# Patient Record
Sex: Male | Born: 1985
Health system: Southern US, Community
[De-identification: ages and names within clinical notes are randomized; demographics above are authoritative.]

## PROBLEM LIST (undated history)

## (undated) DIAGNOSIS — F5101 Primary insomnia: Secondary | ICD-10-CM

## (undated) DIAGNOSIS — F411 Generalized anxiety disorder: Principal | ICD-10-CM

## (undated) HISTORY — DX: Generalized anxiety disorder: F41.1

## (undated) HISTORY — DX: Primary insomnia: F51.01

---

## 2018-12-07 ENCOUNTER — Encounter: Payer: Self-pay | Admitting: Family Medicine

## 2018-12-07 ENCOUNTER — Other Ambulatory Visit: Payer: Self-pay

## 2018-12-07 ENCOUNTER — Ambulatory Visit (INDEPENDENT_AMBULATORY_CARE_PROVIDER_SITE_OTHER): Payer: BLUE CROSS/BLUE SHIELD | Admitting: Family Medicine

## 2018-12-07 VITALS — BP 117/69 | Temp 98.6°F | Ht 72.0 in | Wt 260.0 lb

## 2018-12-07 DIAGNOSIS — F5101 Primary insomnia: Secondary | ICD-10-CM | POA: Diagnosis not present

## 2018-12-07 DIAGNOSIS — F411 Generalized anxiety disorder: Secondary | ICD-10-CM | POA: Diagnosis not present

## 2018-12-07 DIAGNOSIS — Z7189 Other specified counseling: Secondary | ICD-10-CM

## 2018-12-07 MED ORDER — ESCITALOPRAM OXALATE 10 MG PO TABS: 10.0000 mg | ORAL_TABLET | Freq: Every day | ORAL | 1 refills | Status: DC

## 2018-12-07 MED ORDER — LORAZEPAM 0.5 MG PO TABS: 0.5000 mg | ORAL_TABLET | Freq: Every day | ORAL | 0 refills | Status: DC

## 2018-12-07 NOTE — Progress Notes (Signed)
Virtual Visit via Video   Due to the COVID-19 pandemic, this visit was completed with telemedicine (audio/video) technology to reduce patient and provider exposure aswell as to preserve personal protective equipment.   I connected with Gregory Pennington by a video enabled telemedicine application and verified that I am speaking with the correct person using two identifiers. Location patient: Home Location provider: [place] [facility], Office Persons participating in the virtual visit: Gregory HeldJoshua R Pennington, Gregory Cremer, DO Gregory Pennington, CMA acting as scribe for Dr. Helane RimaErica Terence Pennington.   I discussed the limitations of evaluation and management by telemedicine and the availability of in person appointments. The patient expressed understanding and agreed to proceed.  Care Team   Patient Care Team: Gregory RimaWallace, Gregory Farha, DO as PCP - General (Family Medicine)  Subjective:   HPI: Patient has not seen PCP in over 12 years. He has had increased anxiety and depression. He has had issues with in the past but was able to use coping mechanisms to get through it. He has had episodes recently where he feel like his heart is racing and has "tightness" in chest but his pulse is never above 60. He has noticed that he has this when he he is getting "worked up" about something. He has not been sleeping at all recently. Patient has been taking Melatonin 10mg  but it is not helpful at all. His wife did give him 1/2 of a 1 mg ativan last night and he was able to sleep all night and feels better today than he has in a long time.  Patient has no history of drug use, no smoking does drink socially. He does normally exercise but has not been doing that recently. He works from home  Review of Systems  Constitutional: Negative for chills and fever.  HENT: Negative for hearing loss and tinnitus.   Eyes: Negative.   Respiratory: Negative.   Cardiovascular: Positive for chest pain. Negative for palpitations, orthopnea, claudication, leg  swelling and PND.       Has chest pain but pulse will be normal. Feels like heart is beating out of his chest.   Gastrointestinal: Negative.   Genitourinary: Negative.   Musculoskeletal: Negative.   Skin: Negative for rash.  Neurological: Negative.   Endo/Heme/Allergies: Negative.   Psychiatric/Behavioral: Negative.     Patient Active Problem List   Diagnosis Date Noted  . GAD (generalized anxiety disorder) 12/10/2018  . Primary insomnia 12/10/2018    Social History   Tobacco Use  . Smoking status: Never Smoker  . Smokeless tobacco: Never Used  Substance Use Topics  . Alcohol use: Yes    Comment: occ 3-4 drinks on weekend    Current Outpatient Medications:   No Known Allergies  Objective:   VITALS: Per patient if applicable, see vitals. GENERAL: Alert, appears well and in no acute distress. HEENT: Atraumatic, conjunctiva clear, no obvious abnormalities on inspection of external nose and ears. NECK: Normal movements of the head and neck. CARDIOPULMONARY: No increased WOB. Speaking in clear sentences. I:E ratio WNL.  MS: Moves all visible extremities without noticeable abnormality. PSYCH: Pleasant and cooperative, well-groomed. Speech normal rate and rhythm. Affect is appropriate. Insight and judgement are appropriate. Attention is focused, linear, and appropriate.  NEURO: CN grossly intact. Oriented as arrived to appointment on time with no prompting. Moves both UE equally.  SKIN: No obvious lesions, wounds, erythema, or cyanosis noted on face or hands.  Depression screen PHQ 2/9 12/07/2018  Decreased Interest 3  Down, Depressed, Hopeless  3  PHQ - 2 Score 6  Altered sleeping 3  Tired, decreased energy 3  Change in appetite 3  Feeling bad or failure about yourself  3  Trouble concentrating 0  Moving slowly or fidgety/restless 0  Suicidal thoughts 0  PHQ-9 Score 18  Difficult doing work/chores Very difficult    Assessment and Plan:   Gregory Pennington was seen today for  establish care.  Diagnoses and all orders for this visit:  GAD (generalized anxiety disorder) -     escitalopram (LEXAPRO) 10 MG tablet; Take 1 tablet (10 mg total) by mouth daily.  Educated About Covid-19 Virus Infection  Primary insomnia -     LORazepam (ATIVAN) 0.5 MG tablet; Take 1 tablet (0.5 mg total) by mouth at bedtime.    Marland Kitchen COVID-19 Education: The signs and symptoms of COVID-19 were discussed with the patient and how to seek care for testing if needed. The importance of social distancing was discussed today. . Reviewed expectations re: course of current medical issues. . Discussed self-management of symptoms. . Outlined signs and symptoms indicating need for more acute intervention. . Patient verbalized understanding and all questions were answered. Marland Kitchen Health Maintenance issues including appropriate healthy diet, exercise, and smoking avoidance were discussed with patient. . See orders for this visit as documented in the electronic medical record.  Gregory Rima, DO  Records requested if needed. Time spent: 30 minutes, of which >50% was spent in obtaining information about his symptoms, reviewing his previous labs, evaluations, and treatments, counseling him about his condition (please see the discussed topics above), and developing a plan to further investigate it; he had a number of questions which I addressed.

## 2018-12-10 ENCOUNTER — Encounter: Payer: Self-pay | Admitting: Family Medicine

## 2018-12-10 DIAGNOSIS — F411 Generalized anxiety disorder: Secondary | ICD-10-CM | POA: Insufficient documentation

## 2018-12-10 DIAGNOSIS — F5101 Primary insomnia: Secondary | ICD-10-CM

## 2018-12-10 HISTORY — DX: Primary insomnia: F51.01

## 2018-12-10 HISTORY — DX: Generalized anxiety disorder: F41.1

## 2018-12-10 NOTE — Patient Instructions (Signed)

## 2018-12-12 ENCOUNTER — Other Ambulatory Visit: Payer: Self-pay

## 2018-12-12 ENCOUNTER — Encounter (HOSPITAL_BASED_OUTPATIENT_CLINIC_OR_DEPARTMENT_OTHER): Payer: Self-pay

## 2018-12-12 ENCOUNTER — Emergency Department (HOSPITAL_BASED_OUTPATIENT_CLINIC_OR_DEPARTMENT_OTHER)
Admission: EM | Admit: 2018-12-12 | Discharge: 2018-12-12 | Disposition: A | Discharge status: Home / Self Care | Payer: BLUE CROSS/BLUE SHIELD | Attending: Emergency Medicine | Admitting: Emergency Medicine

## 2018-12-12 DIAGNOSIS — W501XXA Accidental kick by another person, initial encounter: Secondary | ICD-10-CM | POA: Diagnosis not present

## 2018-12-12 DIAGNOSIS — S0592XA Unspecified injury of left eye and orbit, initial encounter: Secondary | ICD-10-CM | POA: Insufficient documentation

## 2018-12-12 DIAGNOSIS — Y999 Unspecified external cause status: Secondary | ICD-10-CM | POA: Insufficient documentation

## 2018-12-12 DIAGNOSIS — Y939 Activity, unspecified: Secondary | ICD-10-CM | POA: Insufficient documentation

## 2018-12-12 DIAGNOSIS — Y929 Unspecified place or not applicable: Secondary | ICD-10-CM | POA: Insufficient documentation

## 2018-12-12 MED ORDER — TETRACAINE HCL 0.5 % OP SOLN
2.0000 [drp] | Freq: Once | OPHTHALMIC | Status: AC
  Administered 2018-12-12: 19:00:00 2 [drp] via OPHTHALMIC
  Filled 2018-12-12: qty 4

## 2018-12-12 MED ORDER — MOXIFLOXACIN HCL 0.5 % OP SOLN: 1.0000 [drp] | Freq: Two times a day (BID) | OPHTHALMIC | 0 refills | Status: AC

## 2018-12-12 MED ORDER — FLUORESCEIN SODIUM 1 MG OP STRP
1.0000 | ORAL_STRIP | Freq: Once | OPHTHALMIC | Status: AC
  Administered 2018-12-12: 19:00:00 1 via OPHTHALMIC
  Filled 2018-12-12: qty 1
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
## 2018-12-12 NOTE — ED Notes (Signed)
ED Provider at bedside. 

## 2018-12-12 NOTE — Discharge Instructions (Addendum)
Use the antibiotic drops twice daily for 5 days. May lightly apply ice or cold pack to the eye and surrounding face to reduce pain and swelling.  Antiinflammatory medications: Take 600 mg of ibuprofen every 6 hours or 440 mg (over the counter dose) to 500 mg (prescription dose) of naproxen every 12 hours for the next 3 days. After this time, these medications may be used as needed for pain. Take these medications with food to avoid upset stomach. Choose only one of these medications, do not take them together. Acetaminophen (generic for Tylenol): Should you continue to have additional pain while taking the ibuprofen or naproxen, you may add in acetaminophen as needed. Your daily total maximum amount of acetaminophen from all sources should be limited to 4000mg /day for persons without liver problems, or 2000mg /day for those with liver problems.

## 2018-12-12 NOTE — ED Notes (Signed)
Pt states that he normally wears contacts but does not currently have them in
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
## 2019-01-01 ENCOUNTER — Other Ambulatory Visit: Payer: Self-pay | Admitting: Family Medicine

## 2019-01-01 DIAGNOSIS — F5101 Primary insomnia: Secondary | ICD-10-CM

## 2019-01-09 ENCOUNTER — Ambulatory Visit: Payer: Medicaid Other | Admitting: Family Medicine

## 2019-01-10 NOTE — Progress Notes (Signed)
Virtual Visit via Video   Due to the COVID-19 pandemic, this visit was completed with telemedicine (audio/video) technology to reduce patient and provider exposure as well as to preserve personal protective equipment.   I connected with Gregory Pennington by a video enabled telemedicine application and verified that I am speaking with the correct person using two identifiers. Location patient: Home Location provider: [facility], Office Persons participating in the virtual visit: Gregory Pennington, Nee, DO Barnie Mort, CMA acting as scribe for Dr. Helane Rima.   I discussed the limitations of evaluation and management by telemedicine and the availability of in person appointments. The patient expressed understanding and agreed to proceed.  Care Team   Patient Care Team: Helane Rima, DO as PCP - General (Family Medicine)  Subjective:   HPI: He has had increased headaches over the last two weeks but thinks it may be contacts that need changed.   1. GAD (generalized anxiety disorder) Patient has been taking Lexapro daily. He does see some improvement but does not feel like it is going as well as it could. He he gets home is when he starts having increased anxiety.   2. Primary insomnia  The ativan is helping sleep but has noticed that he is not getting good sleep. Feels like he is tossing and turning a lot during the night.    Depression screen [facility] 2/9 01/11/2019 12/07/2018  Decreased Interest 3 3  Down, Depressed, Hopeless 3 3  PHQ - 2 Score 6 6  Altered sleeping 0 3  Tired, decreased energy 0 3  Change in appetite 3 3  Feeling bad or failure about yourself  3 3  Trouble concentrating 0 0  Moving slowly or fidgety/restless 0 0  Suicidal thoughts 0 0  PHQ-9 Score 12 18  Difficult doing work/chores Somewhat difficult Very difficult   GAD 7 : Generalized Anxiety Score 01/11/2019 12/07/2018  Nervous, Anxious, on Edge 3 3  Control/stop worrying 3 3  Worry too much -  different things 3 3  Trouble relaxing 3 3  Restless 0 3  Easily annoyed or irritable 3 3  Afraid - awful might happen 1 3  Total GAD 7 Score 16 21  Anxiety Difficulty Somewhat difficult Very difficult   Review of Systems  Constitutional: Negative for chills, fever, malaise/fatigue and weight loss.  HENT: Negative for hearing loss and tinnitus.   Eyes: Negative for blurred vision and double vision.  Respiratory: Negative for cough, shortness of breath and wheezing.   Cardiovascular: Negative for chest pain, palpitations and leg swelling.  Gastrointestinal: Negative for abdominal pain, constipation, diarrhea, nausea and vomiting.  Genitourinary: Negative for dysuria and urgency.  Musculoskeletal: Negative for joint pain and myalgias.  Skin: Negative for rash.  Neurological: Negative for dizziness and headaches.  Psychiatric/Behavioral: Negative for depression, substance abuse and suicidal ideas. The patient is not nervous/anxious.     Patient Active Problem List   Diagnosis Date Noted  . GAD (generalized anxiety disorder) 12/10/2018  . Primary insomnia 12/10/2018    Social History   Tobacco Use  . Smoking status: Never Smoker  . Smokeless tobacco: Never Used  Substance Use Topics  . Alcohol use: Yes    Comment: occ 3-4 drinks on weekend     Current Outpatient Medications:  .  escitalopram (LEXAPRO) 20 MG tablet, Take 1 tablet (20 mg total) by mouth daily., Disp: 30 tablet, Rfl: 2 .  LORazepam (ATIVAN) 1 MG tablet, Take 1 tablet (1 mg  total) by mouth at bedtime., Disp: 30 tablet, Rfl: 1  No Known Allergies  Objective:   VITALS: Per patient if applicable, see vitals. GENERAL: Alert, appears well and in no acute distress. HEENT: Atraumatic, conjunctiva clear, no obvious abnormalities on inspection of external nose and ears. NECK: Normal movements of the head and neck. CARDIOPULMONARY: No increased WOB. Speaking in clear sentences. I:E ratio WNL.  MS: Moves all visible  extremities without noticeable abnormality. PSYCH: Pleasant and cooperative, well-groomed. Speech normal rate and rhythm. Affect is appropriate. Insight and judgement are appropriate. Attention is focused, linear, and appropriate.  NEURO: CN grossly intact. Oriented as arrived to appointment on time with no prompting. Moves both UE equally.  SKIN: No obvious lesions, wounds, erythema, or cyanosis noted on face or hands.  Assessment and Plan:   Gregory Pennington was seen today for follow-up.  Diagnoses and all orders for this visit:  GAD (generalized anxiety disorder) -     escitalopram (LEXAPRO) 20 MG tablet; Take 1 tablet (20 mg total) by mouth daily.  Primary insomnia -     LORazepam (ATIVAN) 1 MG tablet; Take 1 tablet (1 mg total) by mouth at bedtime.  Screening for lipid disorders -     Lipid panel; Future  Fatigue, unspecified type -     CBC with Differential/Platelet; Future -     Comprehensive metabolic panel; Future -     TSH; Future  Hyperglycemia -     Hemoglobin A1c; Future    . COVID-19 Education: The signs and symptoms of COVID-19 were discussed with the patient and how to seek care for testing if needed. The importance of social distancing was discussed today. . Reviewed expectations re: course of current medical issues. . Discussed self-management of symptoms. . Outlined signs and symptoms indicating need for more acute intervention. . Patient verbalized understanding and all questions were answered. Marland Kitchen. Health Maintenance issues including appropriate healthy diet, exercise, and smoking avoidance were discussed with patient. . See orders for this visit as documented in the electronic medical record.  Helane RimaErica Zavannah Deblois, DO  Records requested if needed. Time spent: 25 minutes, of which >50% was spent in obtaining information about his symptoms, reviewing his previous labs, evaluations, and treatments, counseling him about his condition (please see the discussed topics above), and  developing a plan to further investigate it; he had a number of questions which I addressed.

## 2019-01-11 ENCOUNTER — Other Ambulatory Visit: Payer: Self-pay

## 2019-01-11 ENCOUNTER — Encounter: Payer: Self-pay | Admitting: Family Medicine

## 2019-01-11 ENCOUNTER — Ambulatory Visit (INDEPENDENT_AMBULATORY_CARE_PROVIDER_SITE_OTHER): Payer: BC Managed Care – PPO | Admitting: Family Medicine

## 2019-01-11 VITALS — Ht 72.0 in | Wt 255.0 lb

## 2019-01-11 DIAGNOSIS — R5383 Other fatigue: Secondary | ICD-10-CM

## 2019-01-11 DIAGNOSIS — R739 Hyperglycemia, unspecified: Secondary | ICD-10-CM

## 2019-01-11 DIAGNOSIS — F5101 Primary insomnia: Secondary | ICD-10-CM | POA: Diagnosis not present

## 2019-01-11 DIAGNOSIS — F411 Generalized anxiety disorder: Secondary | ICD-10-CM | POA: Diagnosis not present

## 2019-01-11 DIAGNOSIS — Z13220 Encounter for screening for lipoid disorders: Secondary | ICD-10-CM

## 2019-01-11 MED ORDER — LORAZEPAM 1 MG PO TABS: 1.0000 mg | ORAL_TABLET | Freq: Every day | ORAL | 1 refills | Status: DC

## 2019-01-11 MED ORDER — ESCITALOPRAM OXALATE 20 MG PO TABS: 20.0000 mg | ORAL_TABLET | Freq: Every day | ORAL | 2 refills | Status: DC

## 2019-01-15 ENCOUNTER — Other Ambulatory Visit: Payer: Self-pay

## 2019-01-15 ENCOUNTER — Other Ambulatory Visit (INDEPENDENT_AMBULATORY_CARE_PROVIDER_SITE_OTHER): Payer: BC Managed Care – PPO

## 2019-01-15 DIAGNOSIS — Z13220 Encounter for screening for lipoid disorders: Secondary | ICD-10-CM

## 2019-01-15 DIAGNOSIS — R739 Hyperglycemia, unspecified: Secondary | ICD-10-CM | POA: Diagnosis not present

## 2019-01-15 DIAGNOSIS — R5383 Other fatigue: Secondary | ICD-10-CM

## 2019-01-15 LAB — COMPREHENSIVE METABOLIC PANEL
ALT: 20 U/L (ref 0–53)
AST: 18 U/L (ref 0–37)
Albumin: 4.3 g/dL (ref 3.5–5.2)
Alkaline Phosphatase: 47 U/L (ref 39–117)
BUN: 15 mg/dL (ref 6–23)
CO2: 28 mEq/L (ref 19–32)
Calcium: 9.1 mg/dL (ref 8.4–10.5)
Chloride: 104 mEq/L (ref 96–112)
Creatinine, Ser: 0.81 mg/dL (ref 0.40–1.50)
GFR: 108.45 mL/min (ref >60.00)
Glucose, Bld: 93 mg/dL (ref 70–99)
Potassium: 3.9 mEq/L (ref 3.5–5.1)
Sodium: 139 mEq/L (ref 135–145)
Total Bilirubin: 0.8 mg/dL (ref 0.2–1.2)
Total Protein: 6.6 g/dL (ref 6.0–8.3)

## 2019-01-15 LAB — CBC WITH DIFFERENTIAL/PLATELET
Basophils Absolute: 0.1 10*3/uL (ref 0.0–0.1)
Basophils Relative: 0.9 % (ref 0.0–3.0)
Eosinophils Absolute: 0.2 10*3/uL (ref 0.0–0.7)
Eosinophils Relative: 3.5 % (ref 0.0–5.0)
HCT: 45.3 % (ref 39.0–52.0)
Hemoglobin: 15.5 g/dL (ref 13.0–17.0)
Lymphocytes Relative: 35.4 % (ref 12.0–46.0)
Lymphs Abs: 2.1 10*3/uL (ref 0.7–4.0)
MCHC: 34.3 g/dL (ref 30.0–36.0)
MCV: 88.8 fl (ref 78.0–100.0)
Monocytes Absolute: 0.5 10*3/uL (ref 0.1–1.0)
Monocytes Relative: 8.8 % (ref 3.0–12.0)
Neutro Abs: 3.1 10*3/uL (ref 1.4–7.7)
Neutrophils Relative %: 51.4 % (ref 43.0–77.0)
Platelets: 131 10*3/uL — ABNORMAL LOW (ref 150.0–400.0)
RBC: 5.1 Mil/uL (ref 4.22–5.81)
RDW: 12.8 % (ref 11.5–15.5)
WBC: 6 10*3/uL (ref 4.0–10.5)

## 2019-01-15 LAB — HEMOGLOBIN A1C: Hgb A1c MFr Bld: 5.3 % (ref 4.6–6.5)

## 2019-01-15 LAB — LIPID PANEL
Cholesterol: 197 mg/dL (ref 0–200)
HDL: 53.5 mg/dL (ref >39.00)
LDL Cholesterol: 124 mg/dL — ABNORMAL HIGH (ref 0–99)
NonHDL: 143.93
Total CHOL/HDL Ratio: 4
Triglycerides: 100 mg/dL (ref 0.0–149.0)
VLDL: 20 mg/dL (ref 0.0–40.0)

## 2019-01-15 LAB — TSH: TSH: 1.57 u[IU]/mL (ref 0.35–4.50)

## 2019-02-01 ENCOUNTER — Ambulatory Visit: Payer: BC Managed Care – PPO | Admitting: Family Medicine

## 2019-02-04 ENCOUNTER — Encounter: Payer: Self-pay | Admitting: Family Medicine

## 2019-02-04 ENCOUNTER — Ambulatory Visit (INDEPENDENT_AMBULATORY_CARE_PROVIDER_SITE_OTHER): Payer: BC Managed Care – PPO | Admitting: Family Medicine

## 2019-02-04 ENCOUNTER — Other Ambulatory Visit: Payer: Self-pay

## 2019-02-04 VITALS — BP 106/73 | HR 63 | Temp 98.1°F | Ht 72.0 in | Wt 255.0 lb

## 2019-02-04 DIAGNOSIS — F5101 Primary insomnia: Secondary | ICD-10-CM

## 2019-02-04 DIAGNOSIS — F411 Generalized anxiety disorder: Secondary | ICD-10-CM | POA: Diagnosis not present

## 2019-02-04 MED ORDER — LORAZEPAM 1 MG PO TABS: 1.0000 mg | ORAL_TABLET | Freq: Every day | ORAL | 1 refills | Status: DC

## 2019-02-04 MED ORDER — ESCITALOPRAM OXALATE 20 MG PO TABS: 20.0000 mg | ORAL_TABLET | Freq: Every day | ORAL | 2 refills | Status: DC

## 2019-02-04 NOTE — Progress Notes (Signed)
Virtual Visit via Video   Due to the COVID-19 pandemic, this visit was completed with telemedicine (audio/video) technology to reduce patient and provider exposure as well as to preserve personal protective equipment.   I connected with Ned GraceJoshua R Merkley by a video enabled telemedicine application and verified that I am speaking with the correct person using two identifiers. Location patient: Home Location provider: [facility], Office Persons participating in the virtual visit: Ann HeldJoshua R Schaller, Alynn Ellithorpe, DO   I discussed the limitations of evaluation and management by telemedicine and the availability of in person appointments. The patient expressed understanding and agreed to proceed.  Care Team   Patient Care Team: Helane RimaWallace, Ziya Coonrod, DO as PCP - General (Family Medicine)  Subjective:   HPI: Doing very well on current regimen of Lexapro 10 mg po BID, Ativan 1 mg po q hs. Tolerating medications with no side effects other than hunger. He feels that he can handle the hunger and maintain or lose weight. No ETOH, except 1/2 can beer the other day while walking through the woods. Daughter had episode of SI over the weekend and he was able to handle it well.  Review of Systems  Constitutional: Negative for chills, fever, malaise/fatigue and weight loss.  Respiratory: Negative for cough, shortness of breath and wheezing.   Cardiovascular: Negative for chest pain, palpitations and leg swelling.  Gastrointestinal: Negative for abdominal pain, constipation, diarrhea, nausea and vomiting.  Genitourinary: Negative for dysuria and urgency.  Musculoskeletal: Negative for joint pain and myalgias.  Skin: Negative for rash.  Neurological: Negative for dizziness and headaches.  Psychiatric/Behavioral: Negative for depression, substance abuse and suicidal ideas. The patient is not nervous/anxious.     Patient Active Problem List   Diagnosis Date Noted  . GAD (generalized anxiety disorder)  12/10/2018  . Primary insomnia 12/10/2018    Social History   Tobacco Use  . Smoking status: Never Smoker  . Smokeless tobacco: Never Used  Substance Use Topics  . Alcohol use: Yes    Comment: occ 3-4 drinks on weekend    Current Outpatient Medications:  .  escitalopram (LEXAPRO) 20 MG tablet, Take 1 tablet (20 mg total) by mouth daily., Disp: 30 tablet, Rfl: 2 .  ibuprofen (ADVIL) 600 MG tablet, Take 600 mg by mouth every 6 (six) hours as needed. Takes prn, Disp: , Rfl:  .  LORazepam (ATIVAN) 1 MG tablet, Take 1 tablet (1 mg total) by mouth at bedtime., Disp: 30 tablet, Rfl: 1  No Known Allergies  Objective:   VITALS: Per patient if applicable, see vitals. GENERAL: Alert and in no acute distress. CARDIOPULMONARY: No increased WOB. Speaking in clear sentences.  PSYCH: Pleasant and cooperative. Speech normal rate and rhythm. Affect is appropriate. Insight and judgement are appropriate. Attention is focused, linear, and appropriate.  NEURO: Oriented as arrived to appointment on time with no prompting.   Assessment and Plan:   Diagnoses and all orders for this visit:  GAD (generalized anxiety disorder) Comments: Taking Lexapro 10 mg po BID. Doing well. Feeling much better. Wants to continue with the regimen. Orders: -     escitalopram (LEXAPRO) 20 MG tablet; Take 1 tablet (20 mg total) by mouth daily.  Primary insomnia Comments: Regimen working. Will continue. Orders: -     LORazepam (ATIVAN) 1 MG tablet; Take 1 tablet (1 mg total) by mouth at bedtime.   Marland Kitchen. COVID-19 Education: The signs and symptoms of COVID-19 were discussed with the patient and how to seek care  for testing if needed. The importance of social distancing was discussed today. . Reviewed expectations re: course of current medical issues. . Discussed self-management of symptoms. . Outlined signs and symptoms indicating need for more acute intervention. . Patient verbalized understanding and all questions were  answered. Marland Kitchen Health Maintenance issues including appropriate healthy diet, exercise, and smoking avoidance were discussed with patient. . See orders for this visit as documented in the electronic medical record.  Briscoe Deutscher, DO  Records requested if needed. Time spent: 25 minutes, of which >50% was spent in obtaining information about his symptoms, reviewing his previous labs, evaluations, and treatments, counseling him about his condition (please see the discussed topics above), and developing a plan to further investigate it; he had a number of questions which I addressed.

## 2019-05-07 ENCOUNTER — Ambulatory Visit: Payer: BC Managed Care – PPO | Admitting: Family Medicine

## 2019-08-20 ENCOUNTER — Telehealth: Payer: Self-pay | Admitting: Physician Assistant

## 2019-08-20 NOTE — Telephone Encounter (Signed)
Please call and work patient in this week to be seen for TOC and to follow up on anxiety and medication refill. Thanks. Pt must have visit before medication can be refilled.

## 2019-08-20 NOTE — Telephone Encounter (Signed)
Patient's wife callss in doing the [facility] to [facility] and also wanted to know if her husband could get a refill on ATIVAN 1 MG, he has 8 pills left.

## 2019-08-20 NOTE — Telephone Encounter (Signed)
Scheduled for Friday @ 1pm

## 2019-08-23 ENCOUNTER — Ambulatory Visit (INDEPENDENT_AMBULATORY_CARE_PROVIDER_SITE_OTHER): Payer: BC Managed Care – PPO | Admitting: Physician Assistant

## 2019-08-23 ENCOUNTER — Encounter: Payer: Self-pay | Admitting: Physician Assistant

## 2019-08-23 VITALS — Ht 72.0 in | Wt 250.0 lb

## 2019-08-23 DIAGNOSIS — F5101 Primary insomnia: Secondary | ICD-10-CM | POA: Diagnosis not present

## 2019-08-23 DIAGNOSIS — F411 Generalized anxiety disorder: Secondary | ICD-10-CM | POA: Diagnosis not present

## 2019-08-23 MED ORDER — ESCITALOPRAM OXALATE 10 MG PO TABS: 10.0000 mg | ORAL_TABLET | Freq: Every day | ORAL | 1 refills | Status: DC

## 2019-08-23 MED ORDER — TRAZODONE HCL 50 MG PO TABS: 25.0000 mg | ORAL_TABLET | Freq: Every evening | ORAL | 3 refills | Status: DC | PRN

## 2019-08-23 NOTE — Progress Notes (Signed)
Virtual Visit via Video   I connected with Gregory Pennington on 08/23/19 at  1:00 PM EST by a video enabled telemedicine application and verified that I am speaking with the correct person using two identifiers. Location patient: Home Location provider: [facility], Office Persons participating in the virtual visit: Novak, Stgermaine PA-C,Donna Titus Dubin, LPN   I discussed the limitations of evaluation and management by telemedicine and the availability of in person appointments. The patient expressed understanding and agreed to proceed.  I acted as a Education administrator for [employer], PA-C [employer], LPN  Subjective:   HPI:   Anxiety Pt following up, presently prescribed Lexapro 20 mg but has only been taking 10 mg past few months because he did not like the way it made him feel, states at times it made him feel "droopy." Denies SI/HI. Although his GAD score was elevated today he reports that he feels like he is in a good place with his mood.  GAD 7 : Generalized Anxiety Score 08/23/2019 02/04/2019 01/11/2019 12/07/2018  Nervous, Anxious, on Edge 3 1 3 3   Control/stop worrying 3 1 3 3   Worry too much - different things 3 1 3 3   Trouble relaxing 2 1 3 3   Restless 0 1 0 3  Easily annoyed or irritable 3 1 3 3   Afraid - awful might happen 3 1 1 3   Total GAD 7 Score 17 7 16 21   Anxiety Difficulty Somewhat difficult Somewhat difficult Somewhat difficult Very difficult    Insomnia Pt following up, presently taking Lorazepam 1 mg at bedtime and he is sleeping all night. He feels like the medication is working well for him. Has not tried any other medication for sleep in the past.  ROS: See pertinent positives and negatives per HPI.  Patient Active Problem List   Diagnosis Date Noted  . GAD (generalized anxiety disorder) 12/10/2018  . Primary insomnia 12/10/2018    Social History   Tobacco Use  . Smoking status: Never Smoker  . Smokeless tobacco: Never Used  Substance Use  Topics  . Alcohol use: Yes    Comment: occ 3-4 drinks on weekend     Current Outpatient Medications:  .  escitalopram (LEXAPRO) 20 MG tablet, Take 1 tablet (20 mg total) by mouth daily., Disp: 90 tablet, Rfl: 2 .  ibuprofen (ADVIL) 600 MG tablet, Take 600 mg by mouth every 6 (six) hours as needed. Takes prn, Disp: , Rfl:  .  LORazepam (ATIVAN) 1 MG tablet, Take 1 tablet (1 mg total) by mouth at bedtime., Disp: 90 tablet, Rfl: 1  No Known Allergies  Objective:   VITALS: Per patient if applicable, see vitals. GENERAL: Alert, appears well and in no acute distress. HEENT: Atraumatic, conjunctiva clear, no obvious abnormalities on inspection of external nose and ears. NECK: Normal movements of the head and neck. CARDIOPULMONARY: No increased WOB. Speaking in clear sentences. I:E ratio WNL.  MS: Moves all visible extremities without noticeable abnormality. PSYCH: Pleasant and cooperative, well-groomed. Speech normal rate and rhythm. Affect is appropriate. Insight and judgement are appropriate. Attention is focused, linear, and appropriate.  NEURO: CN grossly intact. Oriented as arrived to appointment on time with no prompting. Moves both UE equally.  SKIN: No obvious lesions, wounds, erythema, or cyanosis noted on face or hands.  Assessment and Plan:   Bracy was seen today for anxiety and insomnia.  Diagnoses and all orders for this visit:  GAD (generalized anxiety disorder) Well controlled per  patient. Continue Lexapro 10 mg daily. Follow-up in 6 months, sooner if concerns.  Primary insomnia Discussed my concerns with use of Ativan for sleep, as this medication has potential for abuse and dependency. He has not tried other rx medications. Will trial trazodone, patient is agreeable. He will let us know via MyChart if he is having any issues with this medication.  . Reviewed expectations re: course of current medical issues. . Discussed self-management of symptoms. . Outlined signs  and symptoms indicating need for more acute intervention. . Patient verbalized understanding and all questions were answered. Marland Kitchen Health Maintenance issues including appropriate healthy diet, exercise, and smoking avoidance were discussed with patient. . See orders for this visit as documented in the electronic medical record.  I discussed the assessment and treatment plan with the patient. The patient was provided an opportunity to ask questions and all were answered. The patient agreed with the plan and demonstrated an understanding of the instructions.   The patient was advised to call back or seek an in-person evaluation if the symptoms worsen or if the condition fails to improve as anticipated.   CMA or LPN served as scribe during this visit. History, Physical, and Plan performed by medical provider. The above documentation has been reviewed and is accurate and complete.   [place], [place] 08/23/2019

## 2019-08-27 ENCOUNTER — Encounter: Payer: Self-pay | Admitting: Physician Assistant

## 2019-08-27 ENCOUNTER — Other Ambulatory Visit: Payer: Self-pay | Admitting: Physician Assistant

## 2019-08-27 DIAGNOSIS — F5101 Primary insomnia: Secondary | ICD-10-CM

## 2019-08-27 MED ORDER — LORAZEPAM 1 MG PO TABS: 1.0000 mg | ORAL_TABLET | Freq: Every day | ORAL | 1 refills | Status: DC

## 2019-09-16 ENCOUNTER — Encounter: Payer: BC Managed Care – PPO | Admitting: Physician Assistant

## 2020-02-11 ENCOUNTER — Other Ambulatory Visit: Payer: Self-pay | Admitting: Physician Assistant

## 2020-02-11 DIAGNOSIS — F5101 Primary insomnia: Secondary | ICD-10-CM

## 2020-02-11 NOTE — Telephone Encounter (Signed)
Pt requesting refill on Lorazepam, last OV 08/2019.

## 2020-02-11 NOTE — Telephone Encounter (Signed)
Spoke to pt told him Lelon Mast gave him only a 30 day supply with no further refills until seen. Pt verbalized understanding and said would like to make an appt now due to medication not working well.Appointment scheduled for Friday at 7:30 AM virtual with [facility].Pt verbalized understanding.

## 2020-02-11 NOTE — Telephone Encounter (Signed)
Will give 30 day courtesy refill.  No further refills until office visit.

## 2020-02-13 NOTE — Progress Notes (Signed)
   TELEPHONE ENCOUNTER   Patient verbally agreed to telephone visit and is aware that copayment and coinsurance may apply. Patient was treated using telemedicine according to accepted telemedicine protocols.  Location of the patient: home Location of provider: [place] [facility] [employer] of all persons participating in the telemedicine service and role in the encounter: Jarold Motto, [place] , Donnetta Hail  Subjective:   Chief Complaint  Patient presents with  . Anxiety  . Insomnia     HPI   Anxiety Pt currently taking Lexapro 20 mg daily x 3 months. Pt is feeling better. Denies SI/HI. He is taking this medication without any concerning side effects.  Insomnia Pt following up, currently taking Lorazepam 1 mg at bedtime. He feels like it has not been working as well for him over the past 3 months. Pt is getting 7 hours of sleep. It is taking him longer to go to sleep. Pt has been taking CBD gummies to help. He does admit to binge drinking on the weekends and this is affecting his sleep. He doesn't take this medication if he has had "too much to drink."  Plantar Fasciitis History of this in both feet. Requires injections prn. Last injection was 4 years ago. He had a cortisone shot in L foot x 4 years ago. He is in need of repeat injections.    Patient Active Problem List   Diagnosis Date Noted  . GAD (generalized anxiety disorder) 12/10/2018  . Primary insomnia 12/10/2018   Social History   Tobacco Use  . Smoking status: Never Smoker  . Smokeless tobacco: Never Used  Substance Use Topics  . Alcohol use: Yes    Comment: occ 3-4 drinks on weekend     Current Outpatient Medications:  .  ibuprofen (ADVIL) 600 MG tablet, Take 600 mg by mouth every 6 (six) hours as needed. Takes prn, Disp: , Rfl:  .  LORazepam (ATIVAN) 1 MG tablet, Take 1 tablet (1 mg total) by mouth at bedtime., Disp: 30 tablet, Rfl: 0 No Known Allergies  Assessment & Plan:   1. Primary insomnia  Counseled  on alcohol use and sleep disorder. Encouraged patient to decrease alcohol intake. On nights where is not drinking, he may take ativan. Otherwise, I discussed that I don't have any other safe alternatives with his current alcohol intake. Patient verbalized understanding. I have asked him to let me know if/when he is ready to discuss reduction in alcohol.  2. GAD (generalized anxiety disorder) Improved. Continue Lexapro 20 mg daily. Follow-up in 6 months.  3. Plantar fasciitis  Referral to sports medicine.    No orders of the defined types were placed in this encounter.  No orders of the defined types were placed in this encounter.   Jarold Motto, [place] 02/14/2020  Time spent with the patient: 15 minutes, spent in obtaining information about his symptoms, reviewing his previous labs, evaluations, and treatments, counseling him about his condition (please see the discussed topics above), and developing a plan to further investigate it; he had a number of questions which I addressed.

## 2020-02-14 ENCOUNTER — Telehealth (INDEPENDENT_AMBULATORY_CARE_PROVIDER_SITE_OTHER): Payer: BC Managed Care – PPO | Admitting: Physician Assistant

## 2020-02-14 ENCOUNTER — Other Ambulatory Visit: Payer: Self-pay

## 2020-02-14 ENCOUNTER — Encounter: Payer: Self-pay | Admitting: Physician Assistant

## 2020-02-14 VITALS — Ht 72.0 in | Wt 270.0 lb

## 2020-02-14 DIAGNOSIS — F5101 Primary insomnia: Secondary | ICD-10-CM

## 2020-02-14 DIAGNOSIS — F411 Generalized anxiety disorder: Secondary | ICD-10-CM | POA: Diagnosis not present

## 2020-02-14 DIAGNOSIS — M722 Plantar fascial fibromatosis: Secondary | ICD-10-CM

## 2020-02-14 MED ORDER — LORAZEPAM 1 MG PO TABS: 1.0000 mg | ORAL_TABLET | Freq: Every day | ORAL | 2 refills | Status: AC

## 2020-02-14 MED ORDER — ESCITALOPRAM OXALATE 20 MG PO TABS: 20.0000 mg | ORAL_TABLET | Freq: Every day | ORAL | 1 refills | Status: AC

## 2020-02-19 ENCOUNTER — Emergency Department (HOSPITAL_BASED_OUTPATIENT_CLINIC_OR_DEPARTMENT_OTHER)
Admission: EM | Admit: 2020-02-19 | Discharge: 2020-02-19 | Disposition: A | Discharge status: Home / Self Care | Payer: 59 | Attending: Emergency Medicine | Admitting: Emergency Medicine

## 2020-02-19 ENCOUNTER — Emergency Department (HOSPITAL_BASED_OUTPATIENT_CLINIC_OR_DEPARTMENT_OTHER): Payer: 59

## 2020-02-19 ENCOUNTER — Encounter (HOSPITAL_BASED_OUTPATIENT_CLINIC_OR_DEPARTMENT_OTHER): Payer: Self-pay | Admitting: *Deleted

## 2020-02-19 ENCOUNTER — Other Ambulatory Visit: Payer: Self-pay

## 2020-02-19 DIAGNOSIS — Y939 Activity, unspecified: Secondary | ICD-10-CM | POA: Diagnosis not present

## 2020-02-19 DIAGNOSIS — S92511A Displaced fracture of proximal phalanx of right lesser toe(s), initial encounter for closed fracture: Secondary | ICD-10-CM | POA: Insufficient documentation

## 2020-02-19 DIAGNOSIS — S90121A Contusion of right lesser toe(s) without damage to nail, initial encounter: Secondary | ICD-10-CM | POA: Insufficient documentation

## 2020-02-19 DIAGNOSIS — W228XXA Striking against or struck by other objects, initial encounter: Secondary | ICD-10-CM | POA: Insufficient documentation

## 2020-02-19 DIAGNOSIS — S92911A Unspecified fracture of right toe(s), initial encounter for closed fracture: Secondary | ICD-10-CM

## 2020-02-19 DIAGNOSIS — Y999 Unspecified external cause status: Secondary | ICD-10-CM | POA: Diagnosis not present

## 2020-02-19 DIAGNOSIS — Y929 Unspecified place or not applicable: Secondary | ICD-10-CM | POA: Insufficient documentation

## 2020-02-19 DIAGNOSIS — S99921A Unspecified injury of right foot, initial encounter: Secondary | ICD-10-CM | POA: Diagnosis present

## 2020-02-19 DIAGNOSIS — S92531A Displaced fracture of distal phalanx of right lesser toe(s), initial encounter for closed fracture: Secondary | ICD-10-CM | POA: Diagnosis not present

## 2020-02-19 NOTE — ED Provider Notes (Signed)
[facility] EMERGENCY DEPARTMENT Provider Note   CSN: [id number] Arrival date & time: 02/19/20  1756     History Chief Complaint  Patient presents with  . Foot Injury    Gregory Pennington is a 34 y.o. male.  Patient is a 34 year old male presenting with complaints of a right foot injury.  Patient stuck his toe on a door this morning.  He now has purple discoloration and swelling.  Pain is worse with ambulation and relieved with rest.  The history is provided by the patient.       Past Medical History:  Diagnosis Date  . GAD (generalized anxiety disorder) 12/10/2018  . Primary insomnia 12/10/2018    Patient Active Problem List   Diagnosis Date Noted  . GAD (generalized anxiety disorder) 12/10/2018  . Primary insomnia 12/10/2018    History reviewed. No pertinent surgical history.     Family History  Problem Relation Age of Onset  . Diabetes Mother   . Diabetes Father     Social History   Tobacco Use  . Smoking status: Never Smoker  . Smokeless tobacco: Never Used  Substance Use Topics  . Alcohol use: Yes    Comment: occ 3-4 drinks on weekend   . Drug use: Never    Home Medications Prior to Admission medications   Medication Sig Start Date End Date Taking? Authorizing Provider  escitalopram (LEXAPRO) 20 MG tablet Take 1 tablet (20 mg total) by mouth daily. 02/14/20   Jarold Motto, PA  ibuprofen (ADVIL) 600 MG tablet Take 600 mg by mouth every 6 (six) hours as needed. Takes prn    [provider]  LORazepam (ATIVAN) 1 MG tablet Take 1 tablet (1 mg total) by mouth at bedtime. 02/14/20 03/15/20  Jarold Motto, PA    Allergies    Patient has no known allergies.  Review of Systems   Review of Systems  All other systems reviewed and are negative.   Physical Exam Updated Vital Signs BP 137/86   Pulse 72   Temp 98.5 F (36.9 C)   Resp 18   Ht 6' (1.829 m)   Wt 122.5 kg   SpO2 98%   BMI 36.62 kg/m   Physical Exam Vitals and  nursing note reviewed.  Constitutional:      General: He is not in acute distress.    Appearance: Normal appearance. He is not ill-appearing.  HENT:     Head: Normocephalic.  Pulmonary:     Effort: Pulmonary effort is normal.  Musculoskeletal:     Comments: The right fifth toe has swelling and ecchymosis extending above the MTP joint.  There is no angulation and no laceration.  Skin:    General: Skin is warm and dry.  Neurological:     Mental Status: He is alert.     ED Results / Procedures / Treatments   Labs (all labs ordered are listed, but only abnormal results are displayed) Labs Reviewed - No data to display  EKG None  Radiology DG Foot Complete Right  Result Date: 02/19/2020 CLINICAL DATA:  Right fifth toe injury this morning, bruising EXAM: RIGHT FOOT COMPLETE - 3+ VIEW COMPARISON:  None. FINDINGS: Frontal, oblique, lateral views of the right foot are obtained. There is a comminuted intra-articular fracture of the fifth proximal phalanx. Fracture line involves the proximal and distal interphalangeal joints. No other acute bony abnormalities. IMPRESSION: 1. Comminuted intra-articular fifth middle phalangeal fracture. Near anatomic alignment. Electronically Signed   By: Casimiro Needle  Manson Passey M.D.   On: 02/19/2020 18:49    Procedures Procedures (including critical care time)  Medications Ordered in ED Medications - No data to display  ED Course  I have reviewed the triage vital signs and the nursing notes.  Pertinent labs & imaging results that were available during my care of the patient were reviewed by me and considered in my medical decision making (see chart for details).    MDM Rules/Calculators/A&P  X-rays show a fracture of the mid phalanx of the right fifth toe.  This will be treated with buddy taping, rest, and follow-up as needed.  Final Clinical Impression(s) / ED Diagnoses Final diagnoses:  None    Rx / DC Orders ED Discharge Orders    None         Geoffery Lyons, MD 02/19/20 1945

## 2020-02-19 NOTE — ED Triage Notes (Signed)
C/o right foot injury x 12 hrs ago

## 2020-02-19 NOTE — ED Notes (Signed)
4th and 5th toes buddy taped together. Pt given roll of tap with instructions, verbalized understanding.

## 2020-02-19 NOTE — Discharge Instructions (Addendum)
Buddy tape your toes together for comfort and support.  Ice for 20 minutes every 2 hours while awake for the next 2 days.  Ibuprofen 600 mg every 6 hours as needed for pain.  Follow-up with primary doctor if not improving in the next 1 to 2 weeks.

## 2020-02-23 ENCOUNTER — Encounter: Payer: Self-pay | Admitting: Physician Assistant

## 2020-02-23 DIAGNOSIS — M79671 Pain in right foot: Secondary | ICD-10-CM

## 2020-03-06 ENCOUNTER — Ambulatory Visit (INDEPENDENT_AMBULATORY_CARE_PROVIDER_SITE_OTHER): Payer: 59 | Admitting: Podiatry

## 2020-03-06 ENCOUNTER — Ambulatory Visit (INDEPENDENT_AMBULATORY_CARE_PROVIDER_SITE_OTHER): Payer: 59

## 2020-03-06 ENCOUNTER — Other Ambulatory Visit: Payer: Self-pay

## 2020-03-06 DIAGNOSIS — S99921A Unspecified injury of right foot, initial encounter: Secondary | ICD-10-CM

## 2020-03-06 DIAGNOSIS — M722 Plantar fascial fibromatosis: Secondary | ICD-10-CM | POA: Diagnosis not present

## 2020-03-06 DIAGNOSIS — S92514A Nondisplaced fracture of proximal phalanx of right lesser toe(s), initial encounter for closed fracture: Secondary | ICD-10-CM

## 2020-03-06 DIAGNOSIS — M79672 Pain in left foot: Secondary | ICD-10-CM

## 2020-03-06 MED ORDER — MELOXICAM 15 MG PO TABS: 15.0000 mg | ORAL_TABLET | Freq: Every day | ORAL | 0 refills | Status: DC

## 2020-03-06 NOTE — Patient Instructions (Signed)

## 2020-03-08 NOTE — Progress Notes (Signed)
  Subjective:  Patient ID: Gregory Pennington, male    DOB: 1986-06-13,  MRN: [id number]  Chief Complaint  Patient presents with  . Plantar Fasciitis    Bottom of L heel. Pt stated, "I've had it for years. I stretch every day. Cortisone injections have helped. My last injection was 5 yrs ago. Pain is greater than 6/10".  . Fracture    R foot. Pt stated, "I stubbed the 5th toe 2 wks ago. ER diagnosed a fracture. I then hit my toes again last week, and I think I broke the 3rd one".    34 y.o. male presents with the above complaint. History confirmed with patient.   Objective:  Physical Exam: warm, good capillary refill, no trophic changes or ulcerative lesions, normal DP and PT pulses and normal sensory exam. Left Foot: point tenderness over the heel pad and no point tenderness of the mid plantar fascia  Right Foot: Tenderness on the third and fifth digits  Radiographs: X-ray of both feet: Large plantar calcaneal enthesophyte on the left side, right fifth proximal phalanx minimally displaced fracture noted, no fracture of the third toe Assessment:   1. Injury of right foot including toes, initial encounter   2. Left foot pain   3. Plantar fasciitis, left   4. Closed nondisplaced fracture of proximal phalanx of lesser toe of right foot, initial encounter      Plan:  Patient was evaluated and treated and all questions answered.  -Advised to continue buddy taping toes on the right foot.  He has no fracture the third toe but he can buddy tape this to the second for comfort.  He declined to wear surgical shoe, and he has supportive shoes with orthotics and I think this is reasonable.  -Left side he has severe chronic recalcitrant plantar fasciitis present for several years.  I recommended today that we perform a steroid injection which has been helpful for him.  He has appropriate gear and orthotics. -I discussed with him that a certain number of people have a chronic recalcitrant plantar  fasciitis and often need plantar fasciotomy.  If this injection does not help for more than 1 month we will order an MRI to evaluate the plantar fascia. -Meloxicam prescribed  After sterile prep with povidone-iodine solution and alcohol, the left heel was injected with 0.5cc 2% xylocaine plain, 0.5cc 0.5% marcaine plain, 5mg  triamcinolone acetonide, and 2mg  dexamethasone was injected along the plantar  fascia medially at the insertion on the plantar calcaneus. The patient tolerated the procedure well without complication.    Return in about 4 weeks (around 04/03/2020) for recheck plantar fasciitis.

## 2020-03-09 ENCOUNTER — Other Ambulatory Visit: Payer: Self-pay | Admitting: Podiatry

## 2020-03-09 DIAGNOSIS — S92514A Nondisplaced fracture of proximal phalanx of right lesser toe(s), initial encounter for closed fracture: Secondary | ICD-10-CM

## 2020-03-09 DIAGNOSIS — M722 Plantar fascial fibromatosis: Secondary | ICD-10-CM

## 2020-03-31 ENCOUNTER — Ambulatory Visit: Payer: BC Managed Care – PPO | Admitting: Podiatry

## 2020-04-03 ENCOUNTER — Other Ambulatory Visit: Payer: Self-pay | Admitting: Podiatry

## 2020-05-12 ENCOUNTER — Other Ambulatory Visit: Payer: Self-pay | Admitting: Physician Assistant

## 2021-06-15 IMAGING — DX DG FOOT COMPLETE 3+V*R*
4 series · 4 of 4 positions shown · non-contrast
Comparison: None.

CLINICAL DATA: Right fifth toe injury this morning, bruising

EXAM:
RIGHT FOOT COMPLETE - 3+ VIEW

[foot ap]
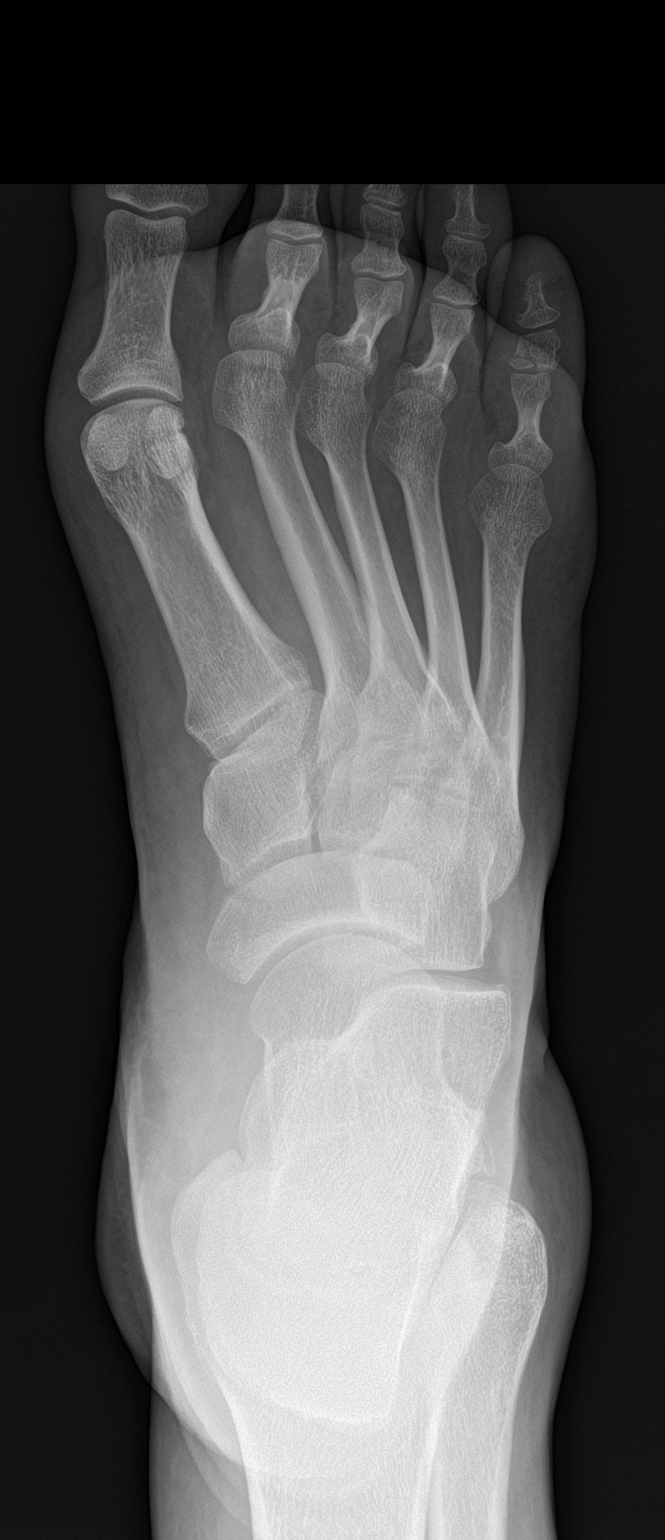

[foot obl]
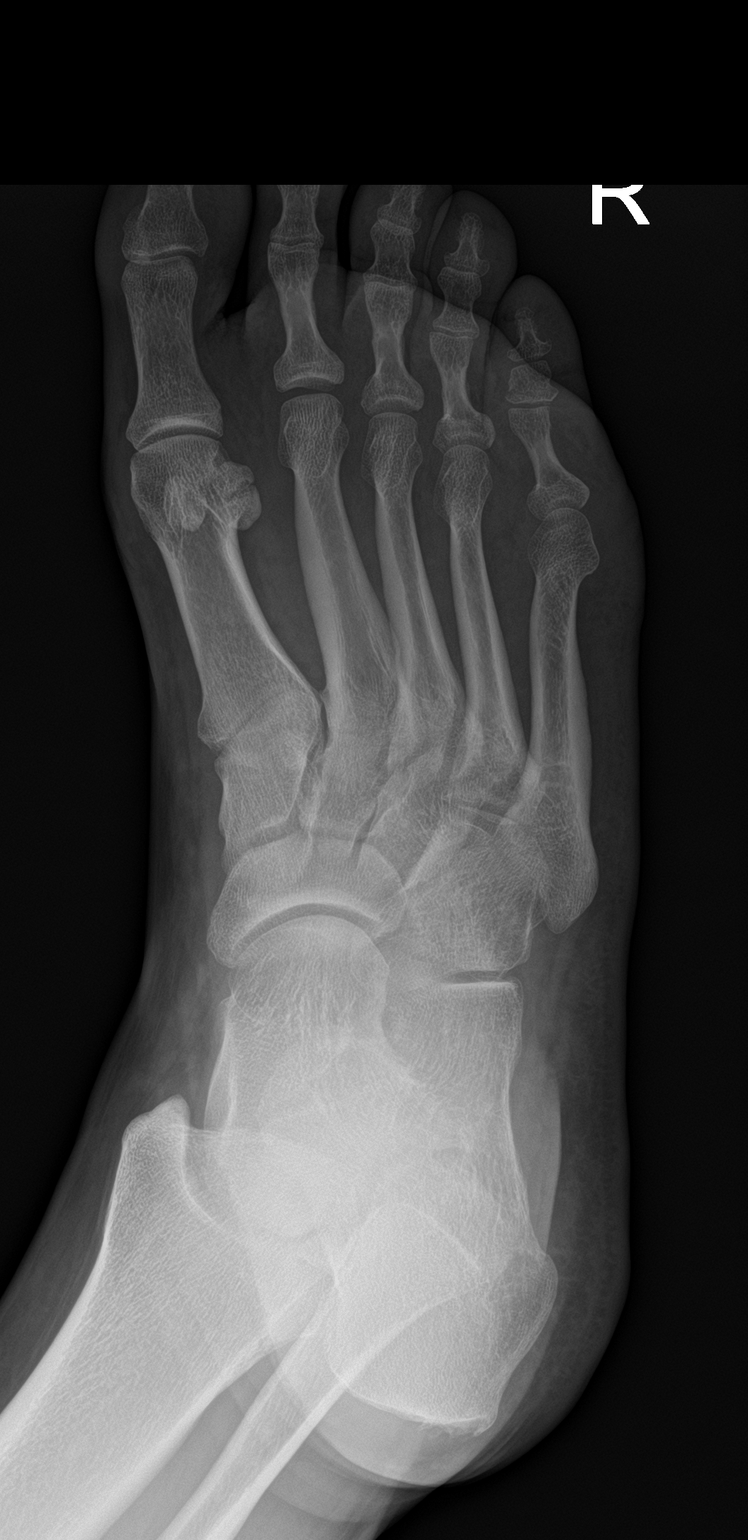

[foot lat (1 of 2)]
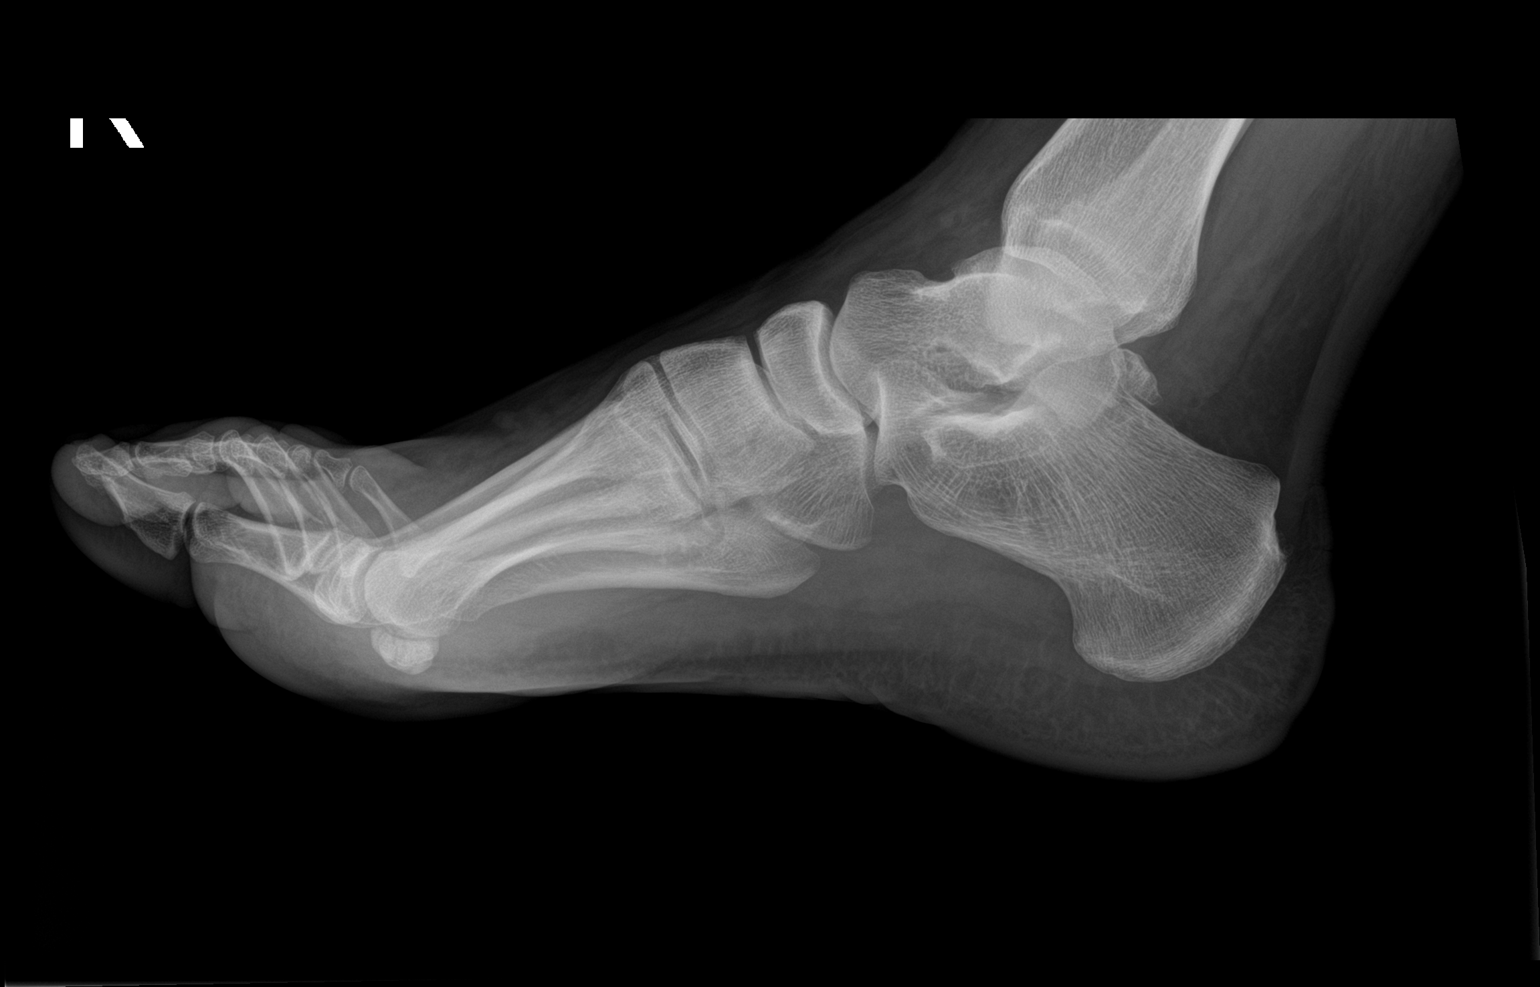

[foot lat (2 of 2)]
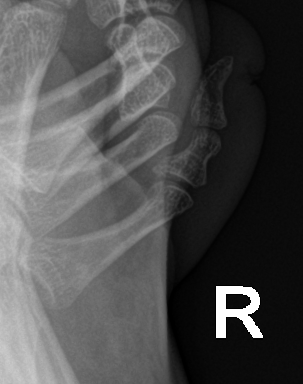

[4 of 4 positions shown; findings below may reference images not displayed]

FINDINGS: Frontal, oblique, lateral views of the right foot are obtained.
There is a comminuted intra-articular fracture of the fifth proximal
phalanx. Fracture line involves the proximal and distal
interphalangeal joints. No other acute bony abnormalities.
IMPRESSION: 1. Comminuted intra-articular fifth middle phalangeal fracture. Near
anatomic alignment.

## 2022-05-02 ENCOUNTER — Encounter: Payer: Self-pay | Admitting: *Deleted

## 2022-07-21 ENCOUNTER — Encounter: Payer: Self-pay | Admitting: *Deleted
